# Patient Record
Sex: Male | Born: 1980 | Race: White | Hispanic: No | Marital: Single | Smoking: Current every day smoker
Health system: Southern US, Community
[De-identification: ages and names within clinical notes are randomized; demographics above are authoritative.]

## PROBLEM LIST (undated history)

## (undated) DIAGNOSIS — K8020 Calculus of gallbladder without cholecystitis without obstruction: Secondary | ICD-10-CM

## (undated) HISTORY — PX: WISDOM TOOTH EXTRACTION: SHX21

---

## 2015-05-02 ENCOUNTER — Other Ambulatory Visit: Payer: Self-pay | Admitting: Family Medicine

## 2015-05-02 DIAGNOSIS — R1013 Epigastric pain: Secondary | ICD-10-CM

## 2015-05-09 ENCOUNTER — Encounter (INDEPENDENT_AMBULATORY_CARE_PROVIDER_SITE_OTHER): Payer: Self-pay

## 2015-05-09 ENCOUNTER — Ambulatory Visit
Admission: RE | Admit: 2015-05-09 | Discharge: 2015-05-09 | Disposition: A | Discharge status: Home / Self Care | Payer: Managed Care, Other (non HMO) | Source: Ambulatory Visit | Attending: Family Medicine | Admitting: Family Medicine

## 2015-05-09 DIAGNOSIS — R1013 Epigastric pain: Secondary | ICD-10-CM

## 2015-05-28 NOTE — Patient Instructions (Addendum)
ANGELLO CHIEN  05/28/2015   Your procedure is scheduled on: 06/05/2015    Report to [facility] Main  Entrance take [place]  elevators to 3rd floor to  Short Stay Center at     0530 AM.  Call this number if you have problems the morning of surgery [phone]   Remember: ONLY 1 PERSON MAY GO WITH YOU TO SHORT STAY TO GET  READY MORNING OF YOUR SURGERY.  Do not eat food or drink liquids :After Midnight.     Take these medicines the morning of surgery with A SIP OF WATER: CLARITIN IF NEEDED                               You may not have any metal on your body including hair pins and              piercings  Do not wear jewelry,  lotions, powders or perfumes, deodorant                       Men may shave face and neck.   Do not bring valuables to the hospital. [facility] IS NOT             RESPONSIBLE   FOR VALUABLES.  Contacts, dentures or bridgework may not be worn into surgery.       Patients discharged the day of surgery will not be allowed to drive home.  Name and phone number of your driver: [facility] [employer] [phone]  Special Instructions: coughing and deep breathing exercises, leg exercises               Please read over the following fact sheets you were given: _____________________________________________________________________             [facility] - Preparing for Surgery Before surgery, you can play an important role.  Because skin is not sterile, your skin needs to be as free of germs as possible.  You can reduce the number of germs on your skin by washing with CHG (chlorahexidine gluconate) soap before surgery.  CHG is an antiseptic cleaner which kills germs and bonds with the skin to continue killing germs even after washing. Please DO NOT use if you have an allergy to CHG or antibacterial soaps.  If your skin becomes reddened/irritated stop using the CHG and inform your nurse when you arrive at Short Stay. Do not shave (including legs  and underarms) for at least 48 hours prior to the first CHG shower.  You may shave your face/neck. Please follow these instructions carefully:  1.  Shower with CHG Soap the night before surgery and the  morning of Surgery.  2.  If you choose to wash your hair, wash your hair first as usual with your  normal  shampoo.  3.  After you shampoo, rinse your hair and body thoroughly to remove the  shampoo.                           4.  Use CHG as you would any other liquid soap.  You can apply chg directly  to the skin and wash                       Gently with  a scrungie or clean washcloth.  5.  Apply the CHG Soap to your body ONLY FROM THE NECK DOWN.   Do not use on face/ open                           Wound or open sores. Avoid contact with eyes, ears mouth and genitals (private parts).                       Wash face,  Genitals (private parts) with your normal soap.             6.  Wash thoroughly, paying special attention to the area where your surgery  will be performed.  7.  Thoroughly rinse your body with warm water from the neck down.  8.  DO NOT shower/wash with your normal soap after using and rinsing off  the CHG Soap.                9.  Pat yourself dry with a clean towel.            10.  Wear clean pajamas.            11.  Place clean sheets on your bed the night of your first shower and do not  sleep with pets. Day of Surgery : Do not apply any lotions/deodorants the morning of surgery.  Please wear clean clothes to the hospital/surgery center.  FAILURE TO FOLLOW THESE INSTRUCTIONS MAY RESULT IN THE CANCELLATION OF YOUR SURGERY PATIENT SIGNATURE_________________________________  NURSE SIGNATURE__________________________________  ________________________________________________________________________

## 2015-05-29 ENCOUNTER — Encounter (HOSPITAL_COMMUNITY): Payer: Self-pay

## 2015-05-29 ENCOUNTER — Encounter (HOSPITAL_COMMUNITY)
Admission: RE | Admit: 2015-05-29 | Discharge: 2015-05-29 | Disposition: A | Discharge status: Home / Self Care | Payer: Managed Care, Other (non HMO) | Source: Ambulatory Visit | Attending: Otolaryngology | Admitting: Otolaryngology

## 2015-05-29 DIAGNOSIS — K8020 Calculus of gallbladder without cholecystitis without obstruction: Secondary | ICD-10-CM | POA: Diagnosis not present

## 2015-05-29 DIAGNOSIS — Z01812 Encounter for preprocedural laboratory examination: Secondary | ICD-10-CM | POA: Insufficient documentation

## 2015-05-29 HISTORY — DX: Calculus of gallbladder without cholecystitis without obstruction: K80.20

## 2015-05-29 LAB — CBC
HEMATOCRIT: 45.1 % (ref 39.0–52.0)
Hemoglobin: 15.6 g/dL (ref 13.0–17.0)
MCH: 30.5 pg (ref 26.0–34.0)
MCHC: 34.6 g/dL (ref 30.0–36.0)
MCV: 88.1 fL (ref 78.0–100.0)
PLATELETS: 210 10*3/uL (ref 150–400)
RBC: 5.12 MIL/uL (ref 4.22–5.81)
RDW: 12.1 % (ref 11.5–15.5)
WBC: 8 10*3/uL (ref 4.0–10.5)

## 2015-06-03 ENCOUNTER — Other Ambulatory Visit: Payer: Self-pay | Admitting: Surgery

## 2015-06-04 NOTE — Anesthesia Preprocedure Evaluation (Addendum)
Anesthesia Evaluation  Patient identified by MRN, date of birth, ID band Patient awake    Reviewed: Allergy & Precautions, NPO status , Patient's Chart, lab work & pertinent test results  History of Anesthesia Complications Negative for: history of anesthetic complications  Airway Mallampati: II  TM Distance: >3 FB Neck ROM: Full    Dental no notable dental hx. (+) Dental Advisory Given   Pulmonary Current Smoker,    Pulmonary exam normal breath sounds clear to auscultation       Cardiovascular negative cardio ROS Normal cardiovascular exam Rhythm:Regular Rate:Normal     Neuro/Psych negative neurological ROS  negative psych ROS   GI/Hepatic negative GI ROS, Neg liver ROS,   Endo/Other  negative endocrine ROS  Renal/GU negative Renal ROS  negative genitourinary   Musculoskeletal negative musculoskeletal ROS (+)   Abdominal   Peds negative pediatric ROS (+)  Hematology negative hematology ROS (+)   Anesthesia Other Findings   Reproductive/Obstetrics negative OB ROS                             Anesthesia Physical Anesthesia Plan  ASA: II  Anesthesia Plan: General   Post-op Pain Management:    Induction: Intravenous  Airway Management Planned: Oral ETT  Additional Equipment:   Intra-op Plan:   Post-operative Plan: Extubation in OR  Informed Consent: I have reviewed the patients History and Physical, chart, labs and discussed the procedure including the risks, benefits and alternatives for the proposed anesthesia with the patient or authorized representative who has indicated his/her understanding and acceptance.   Dental advisory given  Plan Discussed with: CRNA  Anesthesia Plan Comments:         Anesthesia Quick Evaluation  

## 2015-06-04 NOTE — H&P (Signed)
Johnny Owens  Location: [facility] Surgery Patient #: [id number] DOB: 16-Jun-1980 Divorced / Language: [place] / Race: White Male  History of Present Illness   Patient words: New-Gallbladder.   The patient is a 35 year old male who presents for evaluation of gall stones.   His PCP is Dr. Karna Owens. And he was referred by Dr. Modesto Owens.  He comes by himself.   The patient has had pain in his upper abdomen about twice a month for the past couple months. The pain is usually in the right upper quadrant. He cannot associate with any particular food. It often happens at night, at least once can't him up so much at night that he had to take the next day off. He has had no nausea or vomiting, he has had no change in bowel habits, he has not been jaundiced, and he's had no fever. He has no history of peptic ulcer disease, liver disease, pancreatic disease, colon disease. His sister had gallbladder surgery done open.  He has an [place] of abdomen on 05/09/2015. It showed a 1.3 impacted gallstone. Dr. Nash Owens labs - his WBC, LFT's, and lipase were normal.  I discussed with the patient the indications and risks of gall bladder surgery. The primary risks of gall bladder surgery include, but are not limited to, bleeding, infection, common bile duct injury, and open surgery. There is also the risk that the patient may have continued symptoms after surgery. However, the likelihood of improvement in symptoms and return to the patient's normal status is good. We discussed the typical post-operative recovery course. I tried to answer the patient's questions. I gave the patient literature about gall bladder surgery.  Past Medical History: 1. Smokes, he knows that it is bad for his health.  Social History: Divorced. Lives by self. Works in [employer]. His mother can help him with his post op course.    Past Surgical History Johnny Owens, CMA;  05/15/2015 3:10 PM) Oral Surgery  Diagnostic Studies History Johnny Owens, CMA; 05/15/2015 3:10 PM) Colonoscopy never  Allergies Johnny Owens, CMA; 05/15/2015 3:10 PM) No Known Drug Allergies02/05/2015  Medication History Johnny Owens, CMA; 05/15/2015 3:10 PM) Allergy (  Tablet, Oral) Active. Medications Reconciled  Social History Johnny Owens, CMA; 05/15/2015 3:10 PM) Alcohol use Moderate alcohol use. Caffeine use Carbonated beverages. Illicit drug use Uses socially only. Tobacco use Current every day smoker.  Family History Johnny Owens, CMA; 05/15/2015 3:10 PM) First Degree Relatives No pertinent family history    Review of Systems Johnny Owens CMA; 05/15/2015 3:10 PM) General Not Present- Appetite Loss, Chills, Fatigue, Fever, Night Sweats, Weight Gain and Weight Loss. Skin Not Present- Change in Wart/Mole, Dryness, Hives, Jaundice, New Lesions, Non-Healing Wounds, Rash and Ulcer. HEENT Not Present- Earache, Hearing Loss, Hoarseness, Nose Bleed, Oral Ulcers, Ringing in the Ears, Seasonal Allergies, Sinus Pain, Sore Throat, Visual Disturbances, Wears glasses/contact lenses and Yellow Eyes. Respiratory Not Present- Bloody sputum, Chronic Cough, Difficulty Breathing, Snoring and Wheezing. Breast Not Present- Breast Mass, Breast Pain, Nipple Discharge and Skin Changes. Cardiovascular Not Present- Chest Pain, Difficulty Breathing Lying Down, Leg Cramps, Palpitations, Rapid Heart Rate, Shortness of Breath and Swelling of Extremities. Gastrointestinal Present- Abdominal Pain, Bloody Stool and Rectal Pain. Not Present- Bloating, Change in Bowel Habits, Chronic diarrhea, Constipation, Difficulty Swallowing, Excessive gas, Gets full quickly at meals, Hemorrhoids, Indigestion, Nausea and Vomiting. Male Genitourinary Not Present- Blood in Urine, Change in Urinary Stream, Frequency, Impotence, Nocturia, Painful Urination, Urgency and Urine Leakage. Musculoskeletal Not Present-  Back Pain, Joint Pain, Joint Stiffness, Muscle Pain, Muscle Weakness and Swelling of Extremities. Neurological Not Present- Decreased Memory, Fainting, Headaches, Numbness, Seizures, Tingling, Tremor, Trouble walking and Weakness. Psychiatric Not Present- Anxiety, Bipolar, Change in Sleep Pattern, Depression, Fearful and Frequent crying. Endocrine Not Present- Cold Intolerance, Excessive Hunger, Hair Changes, Heat Intolerance, Hot flashes and New Diabetes. Hematology Not Present- Easy Bruising, Excessive bleeding, Gland problems, HIV and Persistent Infections.  Vitals (Chemira Owens CMA; 05/15/2015 3:10 PM) 05/15/2015 3:09 PM Weight: 164.6 lb Height: 66in Body Surface Area: 1.84 m Body Mass Index: 26.57 kg/m  Temp.: 99.37F(Oral)  Pulse: 74 (Regular)  BP: 110/80 (Sitting, Left Arm, Standard)   Physical Exam  General: WN bearded WM alert and generally healthy appearing. HEENT: Normal. Pupils equal.  Neck: Supple. No mass. No thyroid mass. Lymph Nodes: No supraclavicular or cervical nodes.  Lungs: Clear to auscultation and symmetric breath sounds. Heart: RRR. No murmur or rub.  Abdomen: Soft. No mass. No tenderness. No hernia. Normal bowel sounds. No abdominal scars. He is having no symptoms today. Rectal: Not done.  Extremities: Good strength and ROM in upper and lower extremities.  Neurologic: Grossly intact to motor and sensory function. Psychiatric: Has normal mood and affect. Behavior is normal.  Assessment & Plan  1.  GALL BLADDER DISEASE (K82.9)  Plan:   1) Will plan cholecystectomy  2.  Smokes   Johnny Kin, MD, [facility] Surgery Pager: [phone] Office phone:  [phone]

## 2015-06-05 ENCOUNTER — Ambulatory Visit (HOSPITAL_COMMUNITY)
Admission: RE | Admit: 2015-06-05 | Discharge: 2015-06-05 | Disposition: A | Discharge status: Home / Self Care | Payer: Managed Care, Other (non HMO) | Source: Ambulatory Visit | Attending: Surgery | Admitting: Surgery

## 2015-06-05 ENCOUNTER — Encounter (HOSPITAL_COMMUNITY): Payer: Self-pay | Admitting: *Deleted

## 2015-06-05 ENCOUNTER — Ambulatory Visit (HOSPITAL_COMMUNITY): Payer: Managed Care, Other (non HMO) | Admitting: Anesthesiology

## 2015-06-05 ENCOUNTER — Encounter (HOSPITAL_COMMUNITY)
Admission: RE | Disposition: A | Discharge status: Home / Self Care | Payer: Self-pay | Source: Ambulatory Visit | Attending: Surgery

## 2015-06-05 ENCOUNTER — Ambulatory Visit (HOSPITAL_COMMUNITY): Payer: Managed Care, Other (non HMO)

## 2015-06-05 DIAGNOSIS — F17200 Nicotine dependence, unspecified, uncomplicated: Secondary | ICD-10-CM | POA: Diagnosis not present

## 2015-06-05 DIAGNOSIS — K8010 Calculus of gallbladder with chronic cholecystitis without obstruction: Secondary | ICD-10-CM | POA: Insufficient documentation

## 2015-06-05 DIAGNOSIS — Z419 Encounter for procedure for purposes other than remedying health state, unspecified: Secondary | ICD-10-CM

## 2015-06-05 DIAGNOSIS — K8020 Calculus of gallbladder without cholecystitis without obstruction: Secondary | ICD-10-CM | POA: Diagnosis present

## 2015-06-05 HISTORY — PX: CHOLECYSTECTOMY: SHX55

## 2015-06-05 LAB — COMPREHENSIVE METABOLIC PANEL
ALBUMIN: 4.6 g/dL (ref 3.5–5.0)
ALT: 17 U/L (ref 17–63)
ANION GAP: 10 (ref 5–15)
AST: 17 U/L (ref 15–41)
Alkaline Phosphatase: 45 U/L (ref 38–126)
BILIRUBIN TOTAL: 0.6 mg/dL (ref 0.3–1.2)
BUN: 16 mg/dL (ref 6–20)
CO2: 24 mmol/L (ref 22–32)
Calcium: 9.7 mg/dL (ref 8.9–10.3)
Chloride: 108 mmol/L (ref 101–111)
Creatinine, Ser: 1.1 mg/dL (ref 0.61–1.24)
GFR calc Af Amer: >60 mL/min (ref >60)
GFR calc non Af Amer: >60 mL/min (ref >60)
GLUCOSE: 109 mg/dL — AB (ref 65–99)
POTASSIUM: 3.7 mmol/L (ref 3.5–5.1)
Sodium: 142 mmol/L (ref 135–145)
TOTAL PROTEIN: 7 g/dL (ref 6.5–8.1)

## 2015-06-05 LAB — CBC WITH DIFFERENTIAL/PLATELET
BASOS PCT: 0 %
Basophils Absolute: 0 10*3/uL (ref 0.0–0.1)
EOS ABS: 0.5 10*3/uL (ref 0.0–0.7)
EOS PCT: 7 %
HEMATOCRIT: 42.4 % (ref 39.0–52.0)
Hemoglobin: 14.6 g/dL (ref 13.0–17.0)
Lymphocytes Relative: 35 %
Lymphs Abs: 2.7 10*3/uL (ref 0.7–4.0)
MCH: 30.6 pg (ref 26.0–34.0)
MCHC: 34.4 g/dL (ref 30.0–36.0)
MCV: 88.9 fL (ref 78.0–100.0)
MONO ABS: 0.7 10*3/uL (ref 0.1–1.0)
MONOS PCT: 9 %
NEUTROS ABS: 3.7 10*3/uL (ref 1.7–7.7)
Neutrophils Relative %: 49 %
Platelets: 189 10*3/uL (ref 150–400)
RBC: 4.77 MIL/uL (ref 4.22–5.81)
RDW: 12.2 % (ref 11.5–15.5)
WBC: 7.6 10*3/uL (ref 4.0–10.5)

## 2015-06-05 SURGERY — LAPAROSCOPIC CHOLECYSTECTOMY WITH INTRAOPERATIVE CHOLANGIOGRAM
Anesthesia: General | Site: Abdomen

## 2015-06-05 MED ORDER — CEFAZOLIN SODIUM-DEXTROSE 2-3 GM-% IV SOLR
2.0000 g | INTRAVENOUS | Status: AC
  Administered 2015-06-05: 2 g via INTRAVENOUS

## 2015-06-05 MED ORDER — CEFAZOLIN SODIUM-DEXTROSE 2-3 GM-% IV SOLR
INTRAVENOUS | Status: AC
  Filled 2015-06-05: qty 50

## 2015-06-05 MED ORDER — DEXAMETHASONE SODIUM PHOSPHATE 10 MG/ML IJ SOLN
INTRAMUSCULAR | Status: DC | PRN
  Administered 2015-06-05: 10 mg via INTRAVENOUS

## 2015-06-05 MED ORDER — PROPOFOL 10 MG/ML IV BOLUS
INTRAVENOUS | Status: AC
  Filled 2015-06-05: qty 20

## 2015-06-05 MED ORDER — CHLORHEXIDINE GLUCONATE 4 % EX LIQD: 1.0000 "application " | Freq: Once | CUTANEOUS | Status: DC

## 2015-06-05 MED ORDER — HYDROMORPHONE HCL 1 MG/ML IJ SOLN
INTRAMUSCULAR | Status: AC
  Filled 2015-06-05: qty 1

## 2015-06-05 MED ORDER — MIDAZOLAM HCL 5 MG/5ML IJ SOLN
INTRAMUSCULAR | Status: DC | PRN
  Administered 2015-06-05: 2 mg via INTRAVENOUS

## 2015-06-05 MED ORDER — DEXAMETHASONE SODIUM PHOSPHATE 10 MG/ML IJ SOLN
INTRAMUSCULAR | Status: AC
  Filled 2015-06-05: qty 1

## 2015-06-05 MED ORDER — IOHEXOL 300 MG/ML  SOLN
INTRAMUSCULAR | Status: DC | PRN
  Administered 2015-06-05: 4 mL via INTRAVENOUS

## 2015-06-05 MED ORDER — PROPOFOL 10 MG/ML IV BOLUS
INTRAVENOUS | Status: DC | PRN
  Administered 2015-06-05: 150 mg via INTRAVENOUS

## 2015-06-05 MED ORDER — 0.9 % SODIUM CHLORIDE (POUR BTL) OPTIME
TOPICAL | Status: DC | PRN
  Administered 2015-06-05: 1000 mL

## 2015-06-05 MED ORDER — FENTANYL CITRATE (PF) 250 MCG/5ML IJ SOLN
INTRAMUSCULAR | Status: AC
  Filled 2015-06-05: qty 5

## 2015-06-05 MED ORDER — BUPIVACAINE HCL (PF) 0.25 % IJ SOLN
INTRAMUSCULAR | Status: DC | PRN
  Administered 2015-06-05: 30 mL

## 2015-06-05 MED ORDER — LACTATED RINGERS IV SOLN
INTRAVENOUS | Status: DC | PRN
  Administered 2015-06-05: 07:00:00 via INTRAVENOUS

## 2015-06-05 MED ORDER — MIDAZOLAM HCL 2 MG/2ML IJ SOLN
INTRAMUSCULAR | Status: AC
  Filled 2015-06-05: qty 2

## 2015-06-05 MED ORDER — ROCURONIUM BROMIDE 100 MG/10ML IV SOLN
INTRAVENOUS | Status: AC
  Filled 2015-06-05: qty 1

## 2015-06-05 MED ORDER — LIDOCAINE HCL (CARDIAC) 20 MG/ML IV SOLN
INTRAVENOUS | Status: DC | PRN
  Administered 2015-06-05: 50 mg via INTRAVENOUS

## 2015-06-05 MED ORDER — LACTATED RINGERS IR SOLN
Status: DC | PRN
  Administered 2015-06-05: 1000 mL

## 2015-06-05 MED ORDER — FENTANYL CITRATE (PF) 100 MCG/2ML IJ SOLN
INTRAMUSCULAR | Status: DC | PRN
  Administered 2015-06-05: 50 ug via INTRAVENOUS
  Administered 2015-06-05: 100 ug via INTRAVENOUS
  Administered 2015-06-05 (×2): 50 ug via INTRAVENOUS

## 2015-06-05 MED ORDER — SUGAMMADEX SODIUM 200 MG/2ML IV SOLN
INTRAVENOUS | Status: DC | PRN
  Administered 2015-06-05: 150 mg via INTRAVENOUS

## 2015-06-05 MED ORDER — HYDROCODONE-ACETAMINOPHEN 5-325 MG PO TABS: 1.0000 | ORAL_TABLET | Freq: Four times a day (QID) | ORAL | Status: AC | PRN

## 2015-06-05 MED ORDER — LACTATED RINGERS IV SOLN: INTRAVENOUS | Status: DC

## 2015-06-05 MED ORDER — ROCURONIUM BROMIDE 100 MG/10ML IV SOLN
INTRAVENOUS | Status: DC | PRN
  Administered 2015-06-05: 10 mg via INTRAVENOUS
  Administered 2015-06-05: 40 mg via INTRAVENOUS

## 2015-06-05 MED ORDER — ONDANSETRON HCL 4 MG/2ML IJ SOLN: 4.0000 mg | Freq: Once | INTRAMUSCULAR | Status: DC | PRN

## 2015-06-05 MED ORDER — BUPIVACAINE HCL (PF) 0.5 % IJ SOLN
INTRAMUSCULAR | Status: AC
  Filled 2015-06-05: qty 30

## 2015-06-05 MED ORDER — LIDOCAINE HCL (CARDIAC) 20 MG/ML IV SOLN
INTRAVENOUS | Status: AC
  Filled 2015-06-05: qty 5

## 2015-06-05 MED ORDER — HYDROMORPHONE HCL 1 MG/ML IJ SOLN
0.2500 mg | INTRAMUSCULAR | Status: DC | PRN
  Administered 2015-06-05 (×2): 0.25 mg via INTRAVENOUS
  Administered 2015-06-05: 0.5 mg via INTRAVENOUS

## 2015-06-05 MED ORDER — SUGAMMADEX SODIUM 200 MG/2ML IV SOLN
INTRAVENOUS | Status: AC
Start: 2015-06-05 — End: 2015-06-05
  Filled 2015-06-05: qty 2

## 2015-06-05 MED ORDER — ONDANSETRON HCL 4 MG/2ML IJ SOLN
INTRAMUSCULAR | Status: AC
  Filled 2015-06-05: qty 2

## 2015-06-05 MED ORDER — ONDANSETRON HCL 4 MG/2ML IJ SOLN
INTRAMUSCULAR | Status: DC | PRN
  Administered 2015-06-05: 4 mg via INTRAVENOUS

## 2015-06-05 SURGICAL SUPPLY — 38 items
APPLIER CLIP 5 13 M/L LIGAMAX5 (MISCELLANEOUS) ×3
APPLIER CLIP ROT 10 11.4 M/L (STAPLE)
BENZOIN TINCTURE PRP APPL 2/3 (GAUZE/BANDAGES/DRESSINGS) IMPLANT
CABLE HIGH FREQUENCY MONO STRZ (ELECTRODE) ×3 IMPLANT
CHLORAPREP W/TINT 26ML (MISCELLANEOUS) ×3 IMPLANT
CHOLANGIOGRAM CATH TAUT (CATHETERS) ×3 IMPLANT
CLIP APPLIE 5 13 M/L LIGAMAX5 (MISCELLANEOUS) ×1 IMPLANT
CLIP APPLIE ROT 10 11.4 M/L (STAPLE) IMPLANT
CLOSURE WOUND 1/4X4 (GAUZE/BANDAGES/DRESSINGS)
COVER MAYO STAND STRL (DRAPES) ×3 IMPLANT
COVER SURGICAL LIGHT HANDLE (MISCELLANEOUS) IMPLANT
DECANTER SPIKE VIAL GLASS SM (MISCELLANEOUS) IMPLANT
DRAPE C-ARM 42X120 X-RAY (DRAPES) ×3 IMPLANT
DRAPE LAPAROSCOPIC ABDOMINAL (DRAPES) ×3 IMPLANT
ELECT REM PT RETURN 9FT ADLT (ELECTROSURGICAL) ×3
ELECTRODE REM PT RTRN 9FT ADLT (ELECTROSURGICAL) ×1 IMPLANT
GLOVE SURG SIGNA 7.5 PF LTX (GLOVE) ×3 IMPLANT
GOWN STRL REUS W/TWL XL LVL3 (GOWN DISPOSABLE) ×15 IMPLANT
HEMOSTAT SURGICEL 4X8 (HEMOSTASIS) IMPLANT
IV CATH 14GX2 1/4 (CATHETERS) ×3 IMPLANT
IV SET EXTENSION CATH 6 NF (IV SETS) ×3 IMPLANT
KIT BASIN OR (CUSTOM PROCEDURE TRAY) ×3 IMPLANT
LIQUID BAND (GAUZE/BANDAGES/DRESSINGS) ×3 IMPLANT
PAD POSITIONING PINK XL (MISCELLANEOUS) IMPLANT
POSITIONER SURGICAL ARM (MISCELLANEOUS) IMPLANT
POUCH SPECIMEN RETRIEVAL 10MM (ENDOMECHANICALS) ×3 IMPLANT
SCISSORS LAP 5X35 DISP (ENDOMECHANICALS) ×3 IMPLANT
SET IRRIG TUBING LAPAROSCOPIC (IRRIGATION / IRRIGATOR) ×3 IMPLANT
SLEEVE XCEL OPT CAN 5 100 (ENDOMECHANICALS) ×3 IMPLANT
STOPCOCK 4 WAY LG BORE MALE ST (IV SETS) ×3 IMPLANT
STRIP CLOSURE SKIN 1/4X4 (GAUZE/BANDAGES/DRESSINGS) IMPLANT
SUT MNCRL AB 4-0 PS2 18 (SUTURE) ×6 IMPLANT
TAPE CLOTH 4X10 WHT NS (GAUZE/BANDAGES/DRESSINGS) IMPLANT
TOWEL OR 17X26 10 PK STRL BLUE (TOWEL DISPOSABLE) ×3 IMPLANT
TRAY LAPAROSCOPIC (CUSTOM PROCEDURE TRAY) ×3 IMPLANT
TROCAR BLADELESS OPT 5 100 (ENDOMECHANICALS) ×3 IMPLANT
TROCAR XCEL BLUNT TIP 100MML (ENDOMECHANICALS) ×3 IMPLANT
TROCAR XCEL NON-BLD 11X100MML (ENDOMECHANICALS) IMPLANT

## 2015-06-05 NOTE — Discharge Instructions (Signed)
[facility] - DISCHARGE INSTRUCTIONS TO PATIENT  Return to work on:  Next week  Activity:  Driving - May drive in 3 or 4 days, if doing well   Lifting - No lifting more than 15 pounds for 2 weeks, then no limit  Wound Care:   Keep the incisions dry for 2 days.  Then you may shower.    Diet:  As tolerated.  Avoid fatty foods for a couple of weeks.  Follow up appointment:  Call Dr. Allene Pyo office [facility]) at [phone] for an appointment in 2 to 3 weeks.  Medications and dosages:  Resume your home medications.  You have a prescription for:  Vicodin  Call Dr. Ezzard Standing or his office  [phone]) if you have:  Temperature greater than 100.4,  Persistent nausea and vomiting,  Severe uncontrolled pain,  Redness, tenderness, or signs of infection (pain, swelling, redness, odor or green/yellow discharge around the site),  Any other questions or concerns you may have after discharge.  In an emergency, call 911 or go to an Emergency Department at a nearby hospital.     General Anesthesia, Adult, Care After Refer to this sheet in the next few weeks. These instructions provide you with information on caring for yourself after your procedure. Your health care provider may also give you more specific instructions. Your treatment has been planned according to current medical practices, but problems sometimes occur. Call your health care provider if you have any problems or questions after your procedure. WHAT TO EXPECT AFTER THE PROCEDURE After the procedure, it is typical to experience:  Sleepiness.  Nausea and vomiting. HOME CARE INSTRUCTIONS  For the first 24 hours after general anesthesia:  Have a responsible person with you.  Do not drive a car. If you are alone, do not take public transportation.  Do not drink alcohol.  Do not take medicine that has not been prescribed by your health care provider.  Do not sign important papers or make important  decisions.  You may resume a normal diet and activities as directed by your health care provider.  Change bandages (dressings) as directed.  If you have questions or problems that seem related to general anesthesia, call the hospital and ask for the anesthetist or anesthesiologist on call. SEEK MEDICAL CARE IF:  You have nausea and vomiting that continue the day after anesthesia.  You develop a rash. SEEK IMMEDIATE MEDICAL CARE IF:   You have difficulty breathing.  You have chest pain.  You have any allergic problems.   This information is not intended to replace advice given to you by your health care provider. Make sure you discuss any questions you have with your health care provider.   Document Released: 07/05/2000 Document Revised: 04/19/2014 Document Reviewed: 07/28/2011 Elsevier Interactive Patient Education [employer]

## 2015-06-05 NOTE — Anesthesia Postprocedure Evaluation (Signed)
Anesthesia Post Note  Patient: MORAD TAL  Procedure(s) Performed: Procedure(s) (LRB): LAPAROSCOPIC CHOLECYSTECTOMY WITH INTRAOPERATIVE CHOLANGIOGRAM (N/A)  Patient location during evaluation: PACU Anesthesia Type: General Level of consciousness: awake and alert Pain management: pain level controlled Vital Signs Assessment: post-procedure vital signs reviewed and stable Respiratory status: spontaneous breathing, nonlabored ventilation, respiratory function stable and patient connected to nasal cannula oxygen Cardiovascular status: blood pressure returned to baseline and stable Postop Assessment: no signs of nausea or vomiting Anesthetic complications: no    Last Vitals:  Filed Vitals:   06/05/15 1004 06/05/15 1117  BP: 133/79 149/83  Pulse: 83 91  Temp: 36.7 C 37.1 C  Resp: 16 16    Last Pain: There were no vitals filed for this visit.               Zelpha Messing JENNETTE

## 2015-06-05 NOTE — Progress Notes (Signed)
Pt up in room, ambulated down hallway.  Pt tolerated well and is without complaints.

## 2015-06-05 NOTE — Op Note (Signed)
06/05/2015  8:30 AM  PATIENT:  Johnny Owens, 35 y.o., male, MRN: [id number]  PREOP DIAGNOSIS:  symptomatic gallstones  POSTOP DIAGNOSIS:   Chronic cholecystitis, cholelithiasis  PROCEDURE:   Procedure(s):  LAPAROSCOPIC CHOLECYSTECTOMY WITH INTRAOPERATIVE CHOLANGIOGRAM  SURGEON:   Ovidio Kin, M.D.  ASSISTANT:   A. Maisie Fus, M.D.  ANESTHESIA:   general  Anesthesiologist: Karie Schwalbe, MD CRNA: Elyn Peers, CRNA; Paris Lore, CRNA  General  ASA: 2  EBL:  minimal  ml  BLOOD ADMINISTERED: none  DRAINS: none   LOCAL MEDICATIONS USED:   30 cc 1/2% marcaine  SPECIMEN:   Gall bladder  COUNTS CORRECT:  YES  INDICATIONS FOR PROCEDURE:  Johnny Owens is a 35 y.o. (DOB: 1980/10/17) white  male whose primary care physician is Redmond Baseman, MD and comes for cholecystectomy.   The indications and risks of the gall bladder surgery were explained to the patient.  The risks include, but are not limited to, infection, bleeding, common bile duct injury and open surgery.  SURGERY:  The patient was taken to room #1 at [facility] OR.  The abdomen was prepped with chloroprep.  The patient was given 2 gm Ancef at the beginning of the operation.   A time out was held and the surgical checklist run.   An infraumbilical incision was made into the abdominal cavity.  A 12 mm Hasson trocar was inserted into the abdominal cavity through the infraumbilical incision and secured with a 0 Vicryl suture.  Three additional trocars were inserted: a 5 mm trocar in the sub-xiphoid location, a 5 mm trocar in the right mid subcostal area, and a 5 mm trocar in the right lateral subcostal area.   The abdomen was explored and the liver, stomach, and bowel that could be seen were unremarkable.   The gall bladder had some adhesions, consistent with mild cholecystitis.  The gall bladder was identified, grasped, and rotated cephalad.  Disssection was carried down to the gall bladder/cystic duct junction and  the cystic duct isolated.  A clip was placed on the gall bladder side of the cystic duct.   An intra-operative cholangiogram was shot.   The intra-operative cholangiogram was shot using a cut off Taut catheter placed through a 14 gauge angiocath in the RUQ.  The Taut catheter was inserted in the cut cystic duct and secured with an endoclip.  A cholangiogram was shot with 8 cc of 1/2 strength Omnipaque.  Using fluoroscopy, the cholangiogram showed the flow of contrast into the common bile duct, up the hepatic radicals, and into the duodenum.  There was no mass or obstruction.  This was a normal intra-operative cholangiogram.   The Taut catheter was removed.  The cystic duct was tripley endoclipped and the cystic artery was identified and clipped.  The gall bladder was bluntly and sharpley dissected from the gall bladder bed.   After the gall bladder was removed from the liver, the gall bladder bed and [place] were inspected.  There was no bleeding or bile leak.  The gall bladder was placed in a endocatch bag and delivered through the umbilicus.  The abdomen was irrigated with 600 cc saline.   The trocars were then removed.  I infiltrated 30 cc of 1/2% Marcaine into the incisions.  The umbilical port closed with a 0 Vicryl suture and the skin closed with 4-0 Monocryl.  The skin was painted with LiquidBand.  The patient's sponge and needle count were correct.  The patient was  transported to the RR in good condition.   He hopes to go home today.  Ovidio Kin, MD, [facility] Surgery Pager: [phone] Office phone:  [phone]

## 2015-06-05 NOTE — Anesthesia Procedure Notes (Signed)
Procedure Name: Intubation Date/Time: 06/05/2015 7:31 AM Performed by: Paris Lore Pre-anesthesia Checklist: Patient identified, Emergency Drugs available, Suction available, Patient being monitored and Timeout performed Patient Re-evaluated:Patient Re-evaluated prior to inductionOxygen Delivery Method: Circle system utilized Preoxygenation: Pre-oxygenation with 100% oxygen Intubation Type: IV induction Ventilation: Mask ventilation without difficulty Laryngoscope Size: Mac and 4 Grade View: Grade I Tube type: Oral Tube size: 7.5 mm Number of attempts: 1 Airway Equipment and Method: Stylet Placement Confirmation: ETT inserted through vocal cords under direct vision,  positive ETCO2,  CO2 detector and breath sounds checked- equal and bilateral Secured at: 21 cm Tube secured with: Tape Dental Injury: Teeth and Oropharynx as per pre-operative assessment

## 2015-06-05 NOTE — Interval H&P Note (Signed)
History and Physical Interval Note:  06/05/2015 7:19 AM  Johnny Owens  has presented today for surgery, with the diagnosis of symptomatic gallstones  The various methods of treatment have been discussed with the patient and family.  His mother is here with him today.  After consideration of risks, benefits and other options for treatment, the patient has consented to  Procedure(s): LAPAROSCOPIC CHOLECYSTECTOMY WITH INTRAOPERATIVE CHOLANGIOGRAM (N/A) as a surgical intervention .  The patient's history has been reviewed, patient examined, no change in status, stable for surgery.  I have reviewed the patient's chart and labs.  Questions were answered to the patient's satisfaction.     Marvyn Torrez H

## 2015-06-05 NOTE — Transfer of Care (Signed)
Immediate Anesthesia Transfer of Care Note  Patient: Johnny Owens  Procedure(s) Performed: Procedure(s): LAPAROSCOPIC CHOLECYSTECTOMY WITH INTRAOPERATIVE CHOLANGIOGRAM (N/A)  Patient Location: PACU  Anesthesia Type:General  Level of Consciousness:  sedated, patient cooperative and responds to stimulation  Airway & Oxygen Therapy:Patient Spontanous Breathing and Patient connected to face mask oxgen  Post-op Assessment:  Report given to PACU RN and Post -op Vital signs reviewed and stable  Post vital signs:  Reviewed and stable  Last Vitals:  Filed Vitals:   06/05/15 0535  BP: 135/79  Pulse: 80  Temp: 36.7 C  Resp: 16    Complications: No apparent anesthesia complications

## 2015-06-06 ENCOUNTER — Encounter (HOSPITAL_COMMUNITY): Payer: Self-pay | Admitting: Surgery

## 2015-06-12 ENCOUNTER — Encounter (HOSPITAL_COMMUNITY): Payer: Self-pay | Admitting: Surgery

## 2017-02-19 IMAGING — US US ABDOMEN COMPLETE
1 series · 13 of 25 positions shown · non-contrast
Comparison: None in PACs

CLINICAL DATA: Intermittent postprandial epigastric pain over the
past 2 months

EXAM:
ABDOMEN ULTRASOUND COMPLETE

[Series 1: us abdomen complete · 0.22mm/px · 13 of 70 slices shown]
[im 1/70]
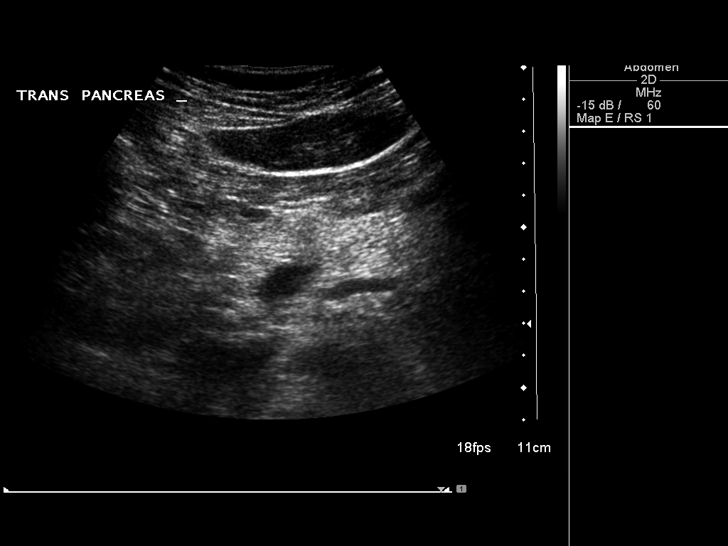
[im 6/70]
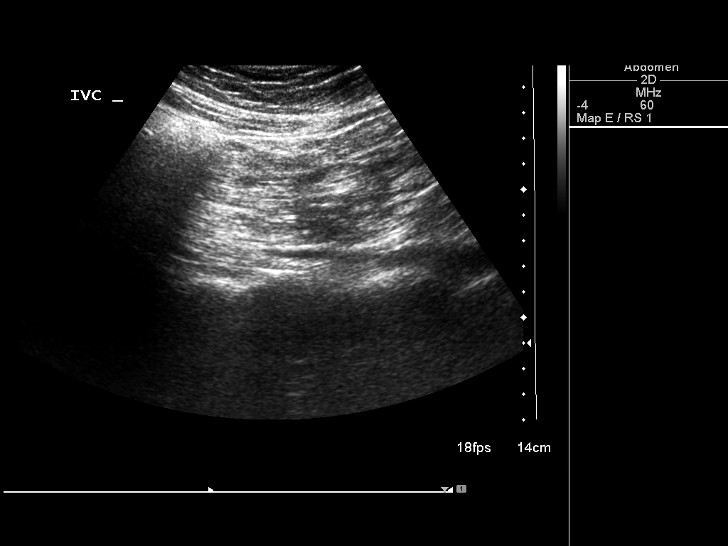
[im 12/70]
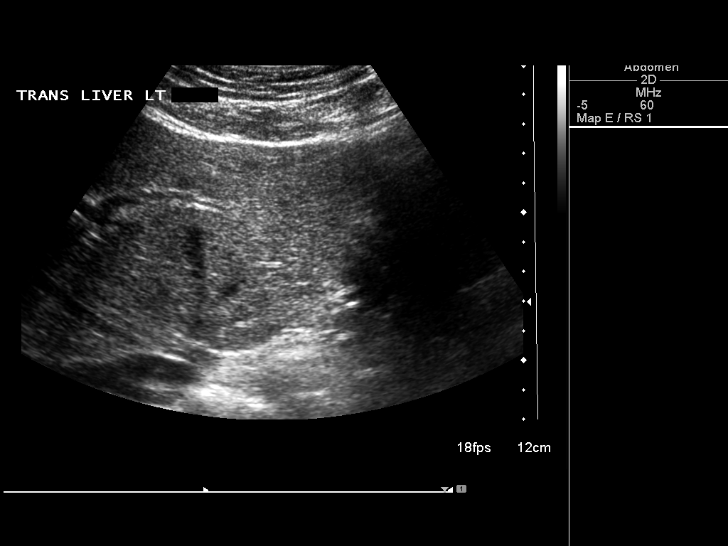
[im 18/70]
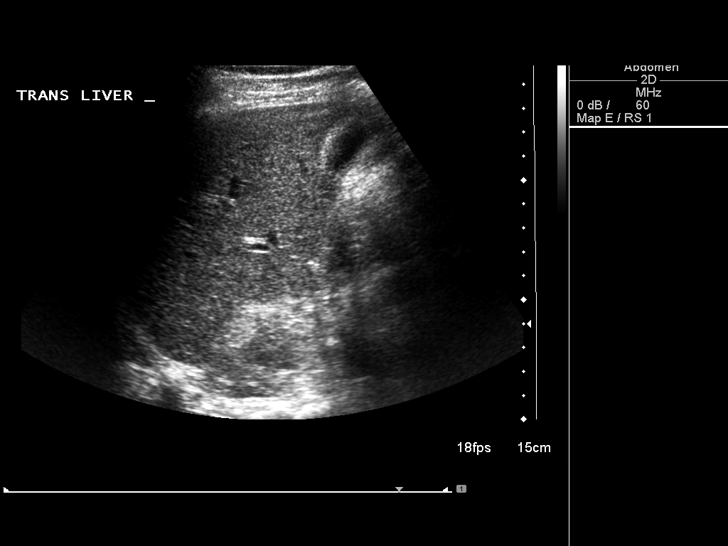
[im 24/70]
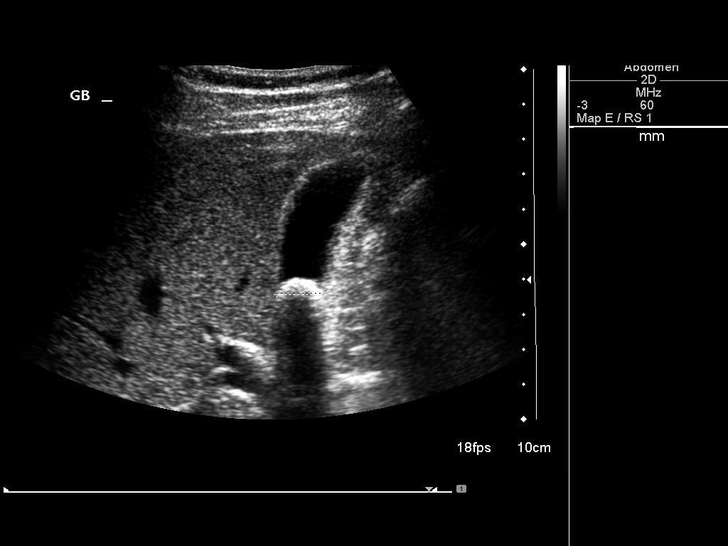
[im 29/70]
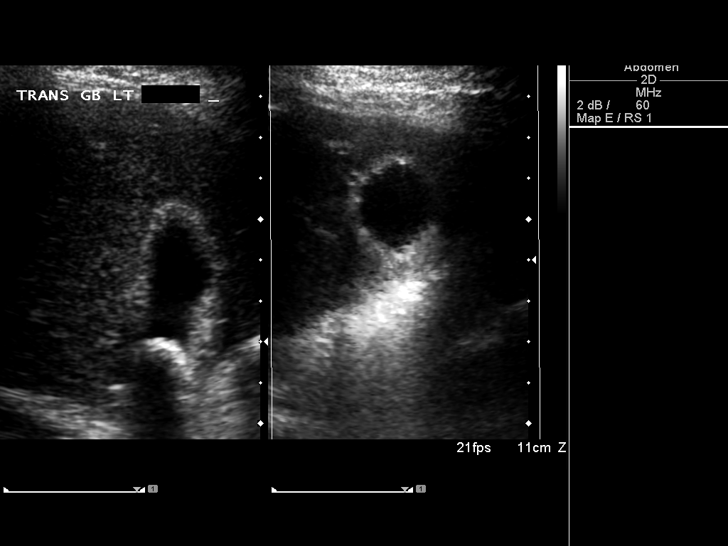
[im 35/70]
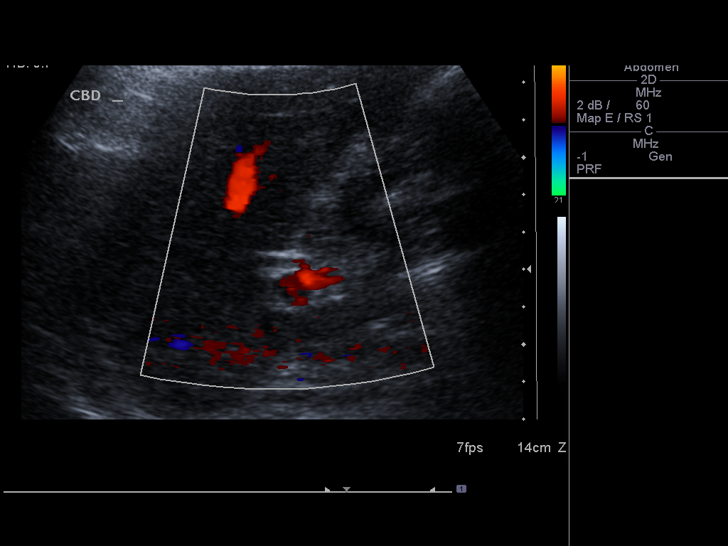
[im 41/70]
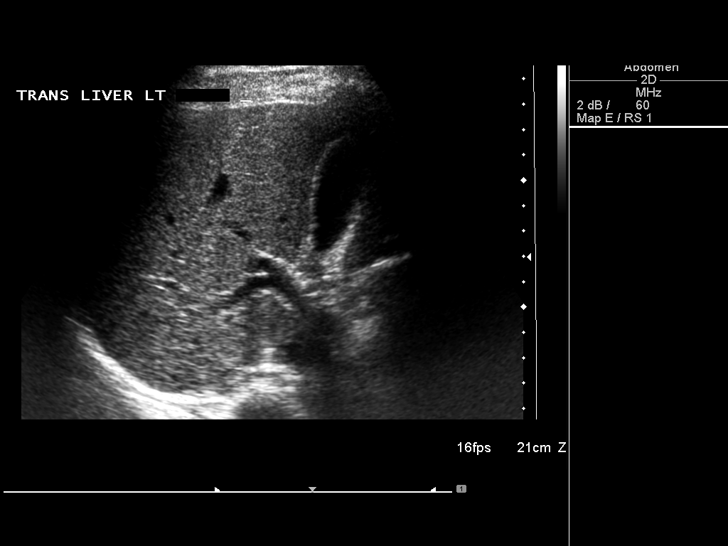
[im 47/70]
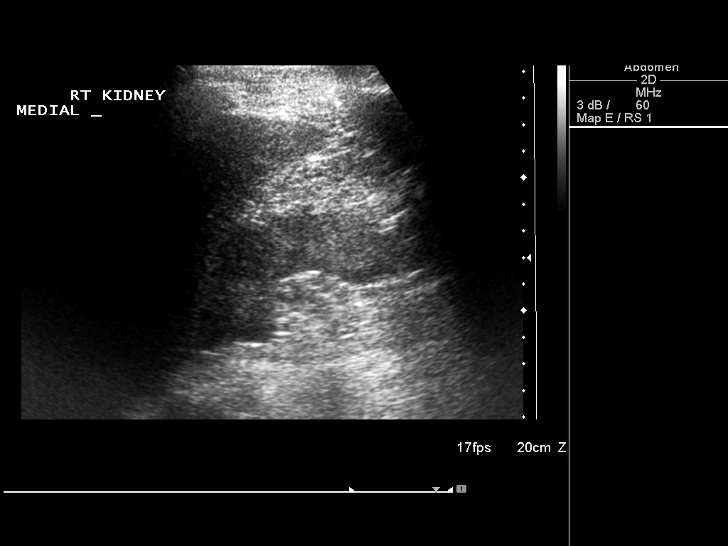
[im 52/70]
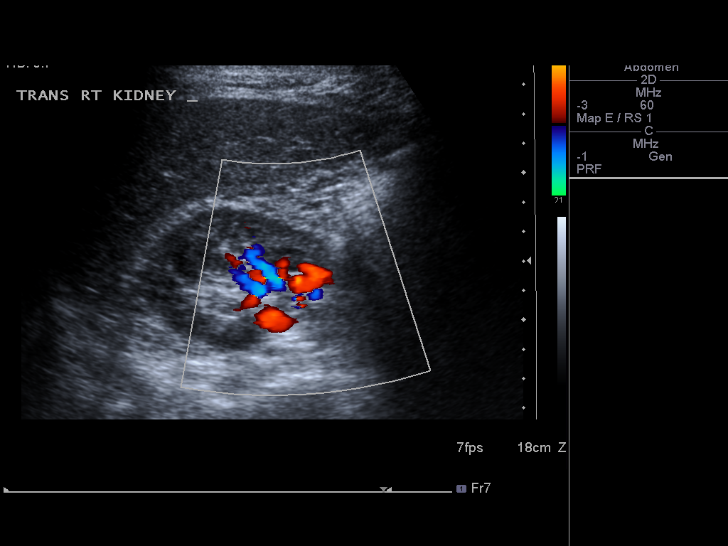
[im 58/70]
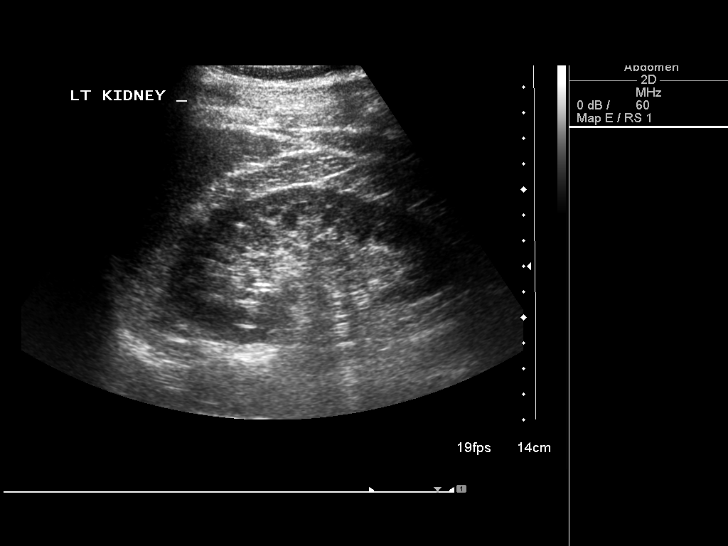
[im 64/70]
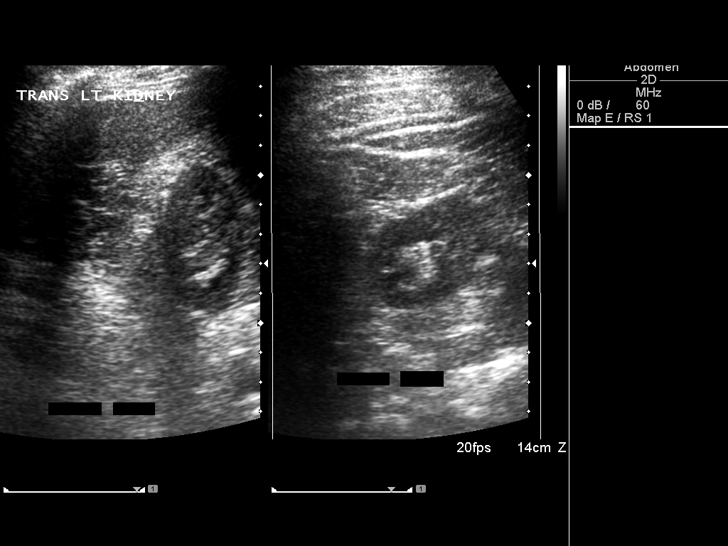
[im 70/70]
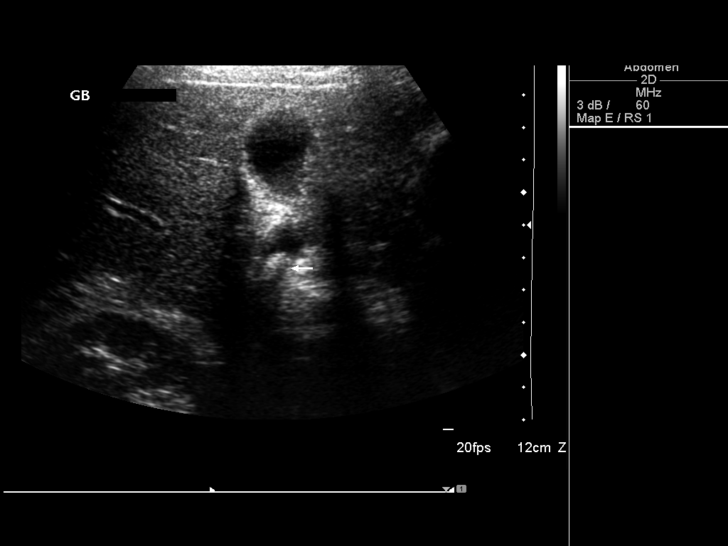

[13 of 25 positions shown; findings below may reference images not displayed]

FINDINGS: Gallbladder: The gallbladder is adequately distended. Near the
gallbladder neck there is an echogenic focus with some distal
shadowing that does not not move consistent with a stone. It
measures approximately 13 mm in diameter. No stones are observed
elsewhere. There are echogenic foci in the gallbladder wall with
some ring down artifact which may reflect adenomyomatosis. There is
no gallbladder wall thickening, pericholecystic fluid, or positive
sonographic Murphy's sign.

Common bile duct: Diameter: 2.7 mm

Liver: No focal lesion identified. Within normal limits in
parenchymal echogenicity.

IVC: No abnormality visualized.

Pancreas: Visualized portion unremarkable. Visualization of the head
and tail is limited.

Spleen: Size and appearance within normal limits.

Right Kidney: Length: 11.4 cm. Echogenicity within normal limits. No
mass or hydronephrosis visualized.

Left Kidney: Length: 11.6 cm. Echogenicity within normal limits. No
mass or hydronephrosis visualized.

Abdominal aorta: No aneurysm visualized.

Other findings: There is no ascites.
IMPRESSION: 1. 1.3 cm diameter non mobile gallstone impacted in the gallbladder
neck. There is no sonographic evidence of acute cholecystitis.
Possible adenomyomatosis which is a chronic hyperplastic process
involving the gallbladder.
2. Limited visualization of the head and tail of the pancreas. The
pancreas is otherwise unremarkable.
3. No acute abnormality is observed elsewhere in the abdomen.

## 2017-03-18 IMAGING — RF DG CHOLANGIOGRAM OPERATIVE
1 series · 4 of 4 positions shown · non-contrast
Comparison: 05/09/2015

CLINICAL DATA: Cholelithiasis

EXAM:
INTRAOPERATIVE CHOLANGIOGRAM
TECHNIQUE: Cholangiographic images from the C-arm fluoroscopic device were
submitted for interpretation post-operatively. Please see the
procedural report for the amount of contrast and the fluoroscopy
time utilized.

[Series 1: run · 4 of 30 frames shown]
[frame 5/30]
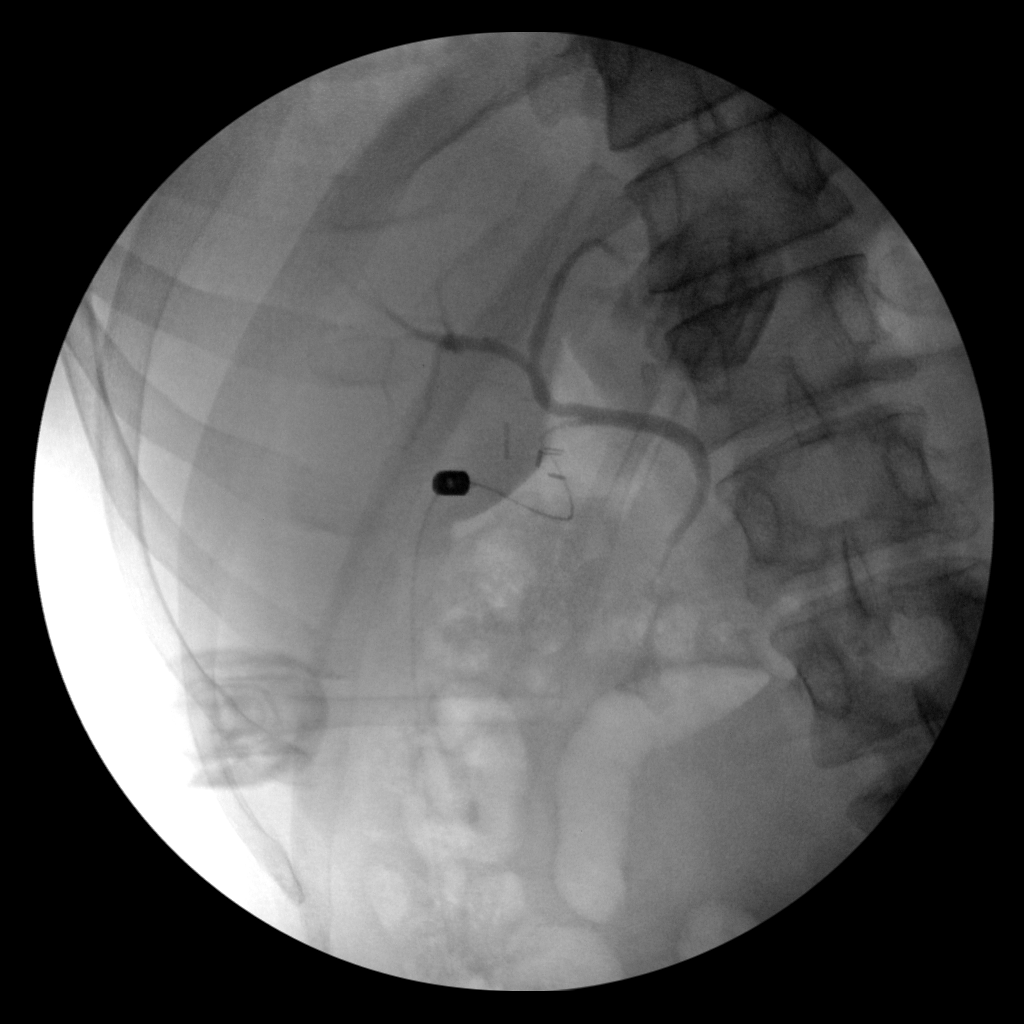
[frame 16/30]
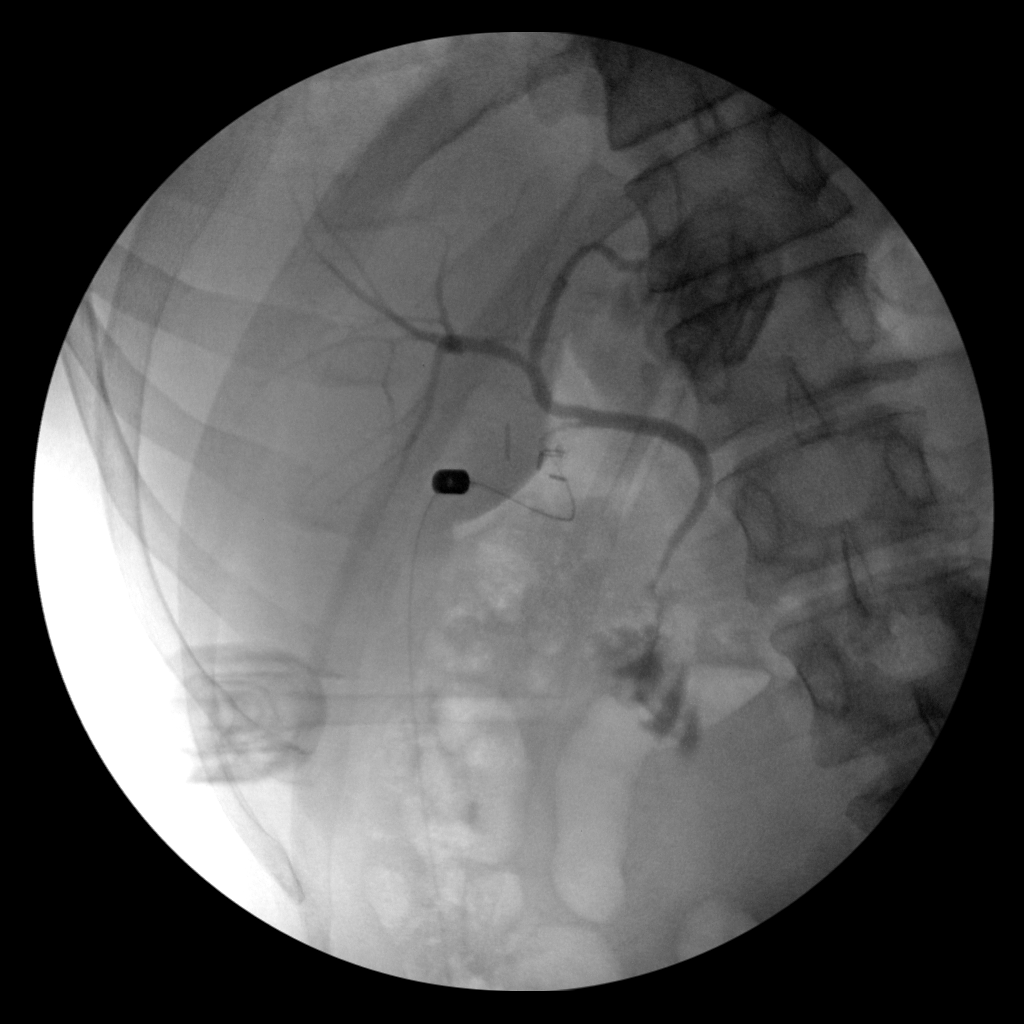
[frame 26/30]
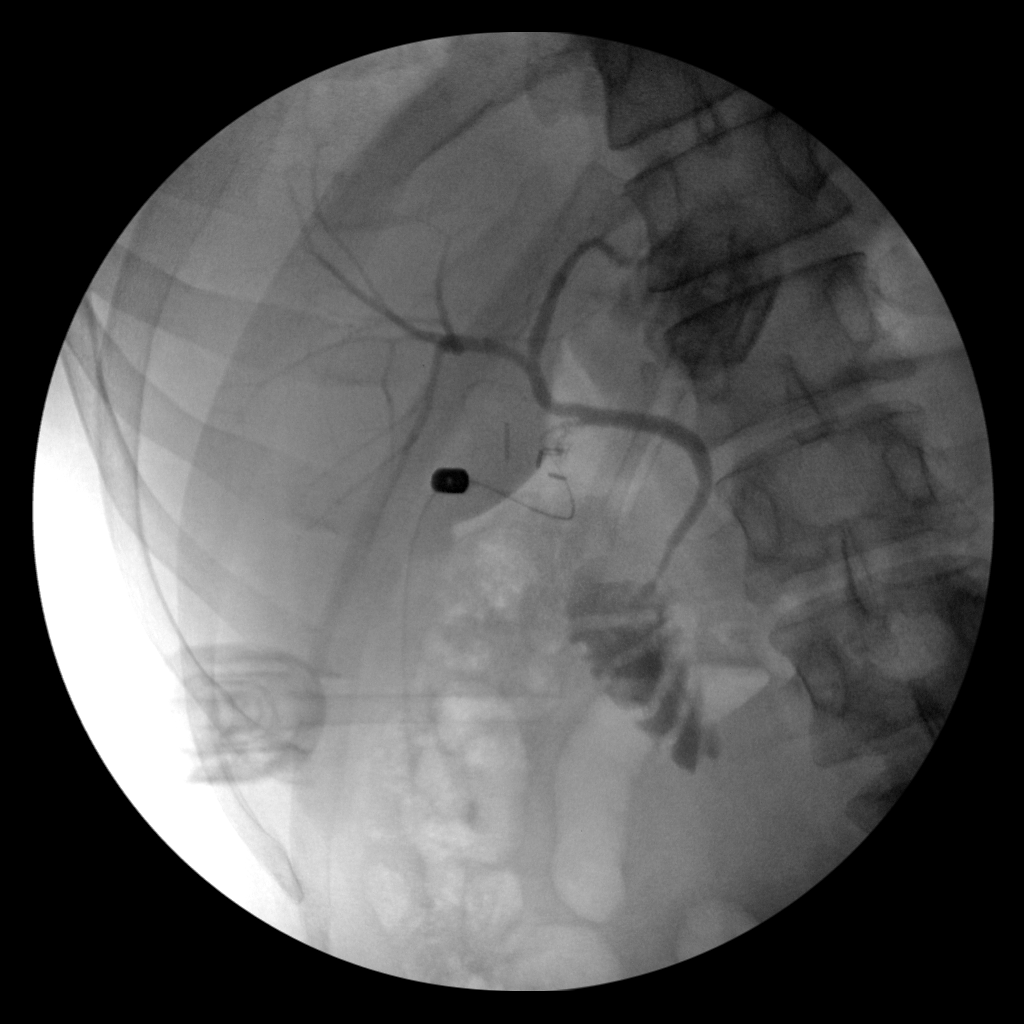
[frame 30/30]
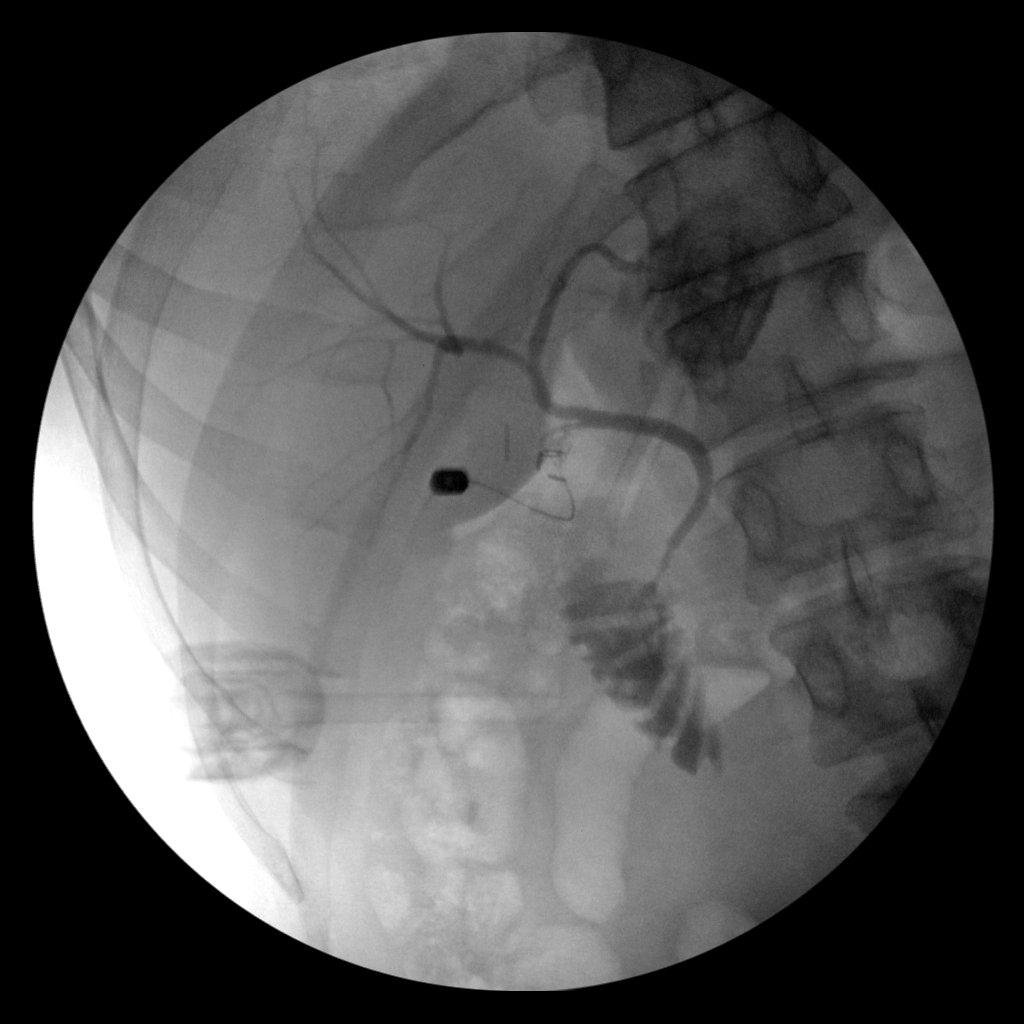

[4 of 4 positions shown; findings below may reference images not displayed]

FINDINGS: Intraoperative cholangiogram performed during the laparoscopic
procedure. The intrahepatic ducts, biliary confluence, common
hepatic duct, residual cystic duct, and common bile duct are patent.
No dilatation or obstruction. No filling defect or stricture.
Contrast drains into the duodenum.
IMPRESSION: Patent biliary system.
